# Patient Record
Sex: Female | Born: 1990 | Hispanic: No | Marital: Single
Health system: Southern US, Community
[De-identification: ages and names within clinical notes are randomized; demographics above are authoritative.]

---

## 2020-06-02 ENCOUNTER — Encounter (HOSPITAL_COMMUNITY): Payer: Self-pay | Admitting: Emergency Medicine

## 2020-06-02 ENCOUNTER — Emergency Department (HOSPITAL_COMMUNITY)
Admission: EM | Admit: 2020-06-02 | Discharge: 2020-06-02 | Disposition: A | Discharge status: Home / Self Care | Payer: 59 | Attending: Emergency Medicine | Admitting: Emergency Medicine

## 2020-06-02 ENCOUNTER — Emergency Department (HOSPITAL_COMMUNITY): Payer: 59

## 2020-06-02 ENCOUNTER — Other Ambulatory Visit: Payer: Self-pay

## 2020-06-02 DIAGNOSIS — M545 Low back pain, unspecified: Secondary | ICD-10-CM | POA: Insufficient documentation

## 2020-06-02 DIAGNOSIS — Y92410 Unspecified street and highway as the place of occurrence of the external cause: Secondary | ICD-10-CM | POA: Insufficient documentation

## 2020-06-02 DIAGNOSIS — S161XXA Strain of muscle, fascia and tendon at neck level, initial encounter: Secondary | ICD-10-CM

## 2020-06-02 DIAGNOSIS — M542 Cervicalgia: Secondary | ICD-10-CM | POA: Insufficient documentation

## 2020-06-02 DIAGNOSIS — S39012A Strain of muscle, fascia and tendon of lower back, initial encounter: Secondary | ICD-10-CM

## 2020-06-02 LAB — I-STAT CHEM 8, ED
BUN: 19 mg/dL (ref 6–20)
Calcium, Ion: 1.25 mmol/L (ref 1.15–1.40)
Chloride: 108 mmol/L (ref 98–111)
Creatinine, Ser: 0.6 mg/dL (ref 0.44–1.00)
Glucose, Bld: 96 mg/dL (ref 70–99)
HCT: 38 % (ref 36.0–46.0)
Hemoglobin: 12.9 g/dL (ref 12.0–15.0)
Potassium: 3.9 mmol/L (ref 3.5–5.1)
Sodium: 142 mmol/L (ref 135–145)
TCO2: 23 mmol/L (ref 22–32)

## 2020-06-02 LAB — I-STAT BETA HCG BLOOD, ED (MC, WL, AP ONLY): I-stat hCG, quantitative: <5 m[IU]/mL (ref <5)

## 2020-06-02 MED ORDER — HYDROCODONE-ACETAMINOPHEN 5-325 MG PO TABS
1.0000 | ORAL_TABLET | Freq: Once | ORAL | Status: AC
  Administered 2020-06-02: 1 via ORAL
  Filled 2020-06-02: qty 1

## 2020-06-02 MED ORDER — NAPROXEN 500 MG PO TABS: 500.0000 mg | ORAL_TABLET | Freq: Two times a day (BID) | ORAL | 0 refills | Status: AC

## 2020-06-02 MED ORDER — IOHEXOL 300 MG/ML  SOLN
100.0000 mL | Freq: Once | INTRAMUSCULAR | Status: AC | PRN
  Administered 2020-06-02: 100 mL via INTRAVENOUS

## 2020-06-02 MED ORDER — CYCLOBENZAPRINE HCL 10 MG PO TABS: 10.0000 mg | ORAL_TABLET | Freq: Every day | ORAL | 0 refills | Status: AC
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
## 2020-06-02 NOTE — ED Notes (Signed)
Pt discharged from this ED in stable condition at this time. All discharge instructions and follow up care reviewed with pt with no further questions at this time. Pt ambulatory with steady gait, clear speech.  

## 2020-06-02 NOTE — Discharge Instructions (Addendum)
Like we discussed, I am prescribing you naproxen which is an anti-inflammatory medication you can take throughout the day.  Please take this as prescribed.  If you are not getting adequate pain relief during the day, you can also take 325 mg of Tylenol 2-3 times a day as well.  I am prescribing you a strong muscle relaxer called flexeril. Please only take this medication once in the evening with dinner. This medication can make you quite drowsy. Do not mix it with alcohol. Do not drive a vehicle after taking it.   If you develop new or worsening symptoms, you can always return to the emergency department for reevaluation.  It was a pleasure to meet you both.  Good luck.
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
